# Patient Record
Sex: Male | Born: 1935 | Race: White | Hispanic: No | Marital: Married | Smoking: Never smoker
Health system: Southern US, Community
[De-identification: ages and names within clinical notes are randomized; demographics above are authoritative.]

## PROBLEM LIST (undated history)

## (undated) DIAGNOSIS — I739 Peripheral vascular disease, unspecified: Secondary | ICD-10-CM

## (undated) DIAGNOSIS — I10 Essential (primary) hypertension: Secondary | ICD-10-CM

## (undated) DIAGNOSIS — E785 Hyperlipidemia, unspecified: Secondary | ICD-10-CM

## (undated) HISTORY — PX: APPENDECTOMY: SHX54

## (undated) HISTORY — PX: PACEMAKER INSERTION: SHX728

## (undated) HISTORY — PX: INGUINAL HERNIA REPAIR: SUR1180

## (undated) HISTORY — DX: Essential (primary) hypertension: I10

## (undated) HISTORY — DX: Hyperlipidemia, unspecified: E78.5

## (undated) HISTORY — DX: Peripheral vascular disease, unspecified: I73.9

---

## 2004-12-30 ENCOUNTER — Ambulatory Visit: Payer: Self-pay | Admitting: Family Medicine

## 2005-01-30 ENCOUNTER — Ambulatory Visit: Payer: Self-pay | Admitting: Family Medicine

## 2005-02-06 ENCOUNTER — Encounter (INDEPENDENT_AMBULATORY_CARE_PROVIDER_SITE_OTHER): Payer: Self-pay | Admitting: Family Medicine

## 2005-02-06 LAB — CONVERTED CEMR LAB: PSA: 4.2 ng/mL

## 2005-02-08 ENCOUNTER — Encounter (HOSPITAL_COMMUNITY): Admission: RE | Admit: 2005-02-08 | Discharge: 2005-03-10 | Payer: Self-pay | Admitting: Family Medicine

## 2005-03-13 ENCOUNTER — Encounter (HOSPITAL_COMMUNITY): Admission: RE | Admit: 2005-03-13 | Discharge: 2005-04-12 | Payer: Self-pay | Admitting: Family Medicine

## 2005-03-21 ENCOUNTER — Ambulatory Visit: Payer: Self-pay | Admitting: Family Medicine

## 2005-04-07 ENCOUNTER — Ambulatory Visit: Payer: Self-pay | Admitting: Family Medicine

## 2005-04-17 ENCOUNTER — Encounter (HOSPITAL_COMMUNITY): Admission: RE | Admit: 2005-04-17 | Discharge: 2005-05-17 | Payer: Self-pay | Admitting: Family Medicine

## 2005-07-17 ENCOUNTER — Ambulatory Visit: Payer: Self-pay | Admitting: Family Medicine

## 2005-07-19 ENCOUNTER — Ambulatory Visit (HOSPITAL_COMMUNITY): Admission: RE | Admit: 2005-07-19 | Discharge: 2005-07-19 | Payer: Self-pay | Admitting: Family Medicine

## 2005-10-17 ENCOUNTER — Ambulatory Visit: Payer: Self-pay | Admitting: Family Medicine

## 2005-10-17 ENCOUNTER — Inpatient Hospital Stay (HOSPITAL_COMMUNITY): Admission: EM | Admit: 2005-10-17 | Discharge: 2005-10-20 | Payer: Self-pay | Admitting: Emergency Medicine

## 2005-10-18 ENCOUNTER — Ambulatory Visit: Payer: Self-pay | Admitting: *Deleted

## 2005-10-20 ENCOUNTER — Ambulatory Visit (HOSPITAL_COMMUNITY): Admission: RE | Admit: 2005-10-20 | Discharge: 2005-10-21 | Payer: Self-pay | Admitting: Internal Medicine

## 2005-10-20 ENCOUNTER — Ambulatory Visit: Payer: Self-pay | Admitting: Internal Medicine

## 2005-11-02 ENCOUNTER — Ambulatory Visit: Payer: Self-pay

## 2005-11-03 ENCOUNTER — Ambulatory Visit: Payer: Self-pay | Admitting: Internal Medicine

## 2005-11-03 ENCOUNTER — Ambulatory Visit (HOSPITAL_COMMUNITY): Admission: RE | Admit: 2005-11-03 | Discharge: 2005-11-03 | Payer: Self-pay | Admitting: Internal Medicine

## 2005-11-22 ENCOUNTER — Ambulatory Visit: Payer: Self-pay

## 2005-11-28 ENCOUNTER — Ambulatory Visit: Payer: Self-pay | Admitting: Family Medicine

## 2005-12-28 ENCOUNTER — Ambulatory Visit: Payer: Self-pay | Admitting: Family Medicine

## 2005-12-29 ENCOUNTER — Encounter (INDEPENDENT_AMBULATORY_CARE_PROVIDER_SITE_OTHER): Payer: Self-pay | Admitting: Family Medicine

## 2005-12-29 LAB — CONVERTED CEMR LAB: PSA: 5 ng/mL

## 2006-01-25 ENCOUNTER — Ambulatory Visit: Payer: Self-pay | Admitting: Family Medicine

## 2006-01-26 ENCOUNTER — Ambulatory Visit (HOSPITAL_COMMUNITY): Admission: RE | Admit: 2006-01-26 | Discharge: 2006-01-26 | Payer: Self-pay | Admitting: Family Medicine

## 2006-01-26 ENCOUNTER — Encounter (INDEPENDENT_AMBULATORY_CARE_PROVIDER_SITE_OTHER): Payer: Self-pay | Admitting: Family Medicine

## 2006-01-26 LAB — CONVERTED CEMR LAB
Blood Glucose, Fasting: 89 mg/dL
RBC count: 4 10*6/uL
TSH: 2.367 microintl units/mL
WBC, blood: 4.1 10*3/uL

## 2006-02-01 ENCOUNTER — Ambulatory Visit: Payer: Self-pay | Admitting: Family Medicine

## 2006-02-06 ENCOUNTER — Ambulatory Visit: Payer: Self-pay | Admitting: Internal Medicine

## 2006-03-05 ENCOUNTER — Ambulatory Visit: Payer: Self-pay | Admitting: Family Medicine

## 2006-03-07 ENCOUNTER — Ambulatory Visit (HOSPITAL_COMMUNITY): Admission: RE | Admit: 2006-03-07 | Discharge: 2006-03-07 | Payer: Self-pay | Admitting: Family Medicine

## 2006-03-09 ENCOUNTER — Ambulatory Visit (HOSPITAL_COMMUNITY): Admission: RE | Admit: 2006-03-09 | Discharge: 2006-03-09 | Payer: Self-pay | Admitting: Internal Medicine

## 2006-04-04 ENCOUNTER — Ambulatory Visit (HOSPITAL_COMMUNITY): Admission: RE | Admit: 2006-04-04 | Discharge: 2006-04-04 | Payer: Self-pay | Admitting: Internal Medicine

## 2006-04-23 ENCOUNTER — Ambulatory Visit: Payer: Self-pay | Admitting: Family Medicine

## 2006-05-08 ENCOUNTER — Ambulatory Visit: Payer: Self-pay | Admitting: Family Medicine

## 2006-07-31 IMAGING — CR DG HIP COMPLETE 2+V*R*
4 series · 4 of 4 positions shown · non-contrast
Comparison: none

CLINICAL DATA: Right groin pain for six months.  Right leg pain.
 RIGHT HIP ?2 VIEWS:

[view not recorded (1 of 4)]
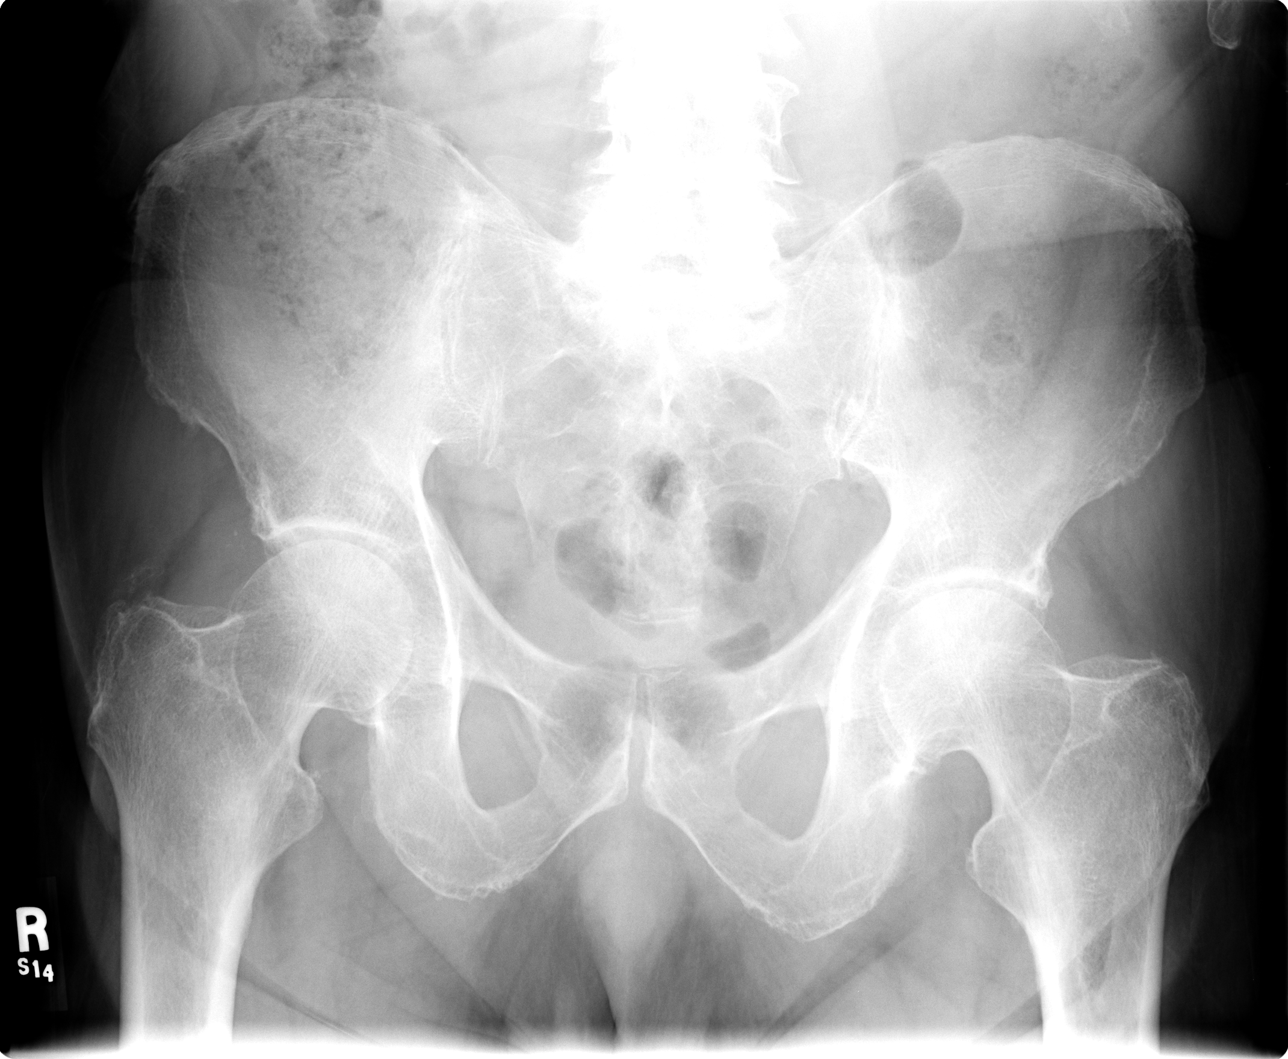

[view not recorded (2 of 4)]
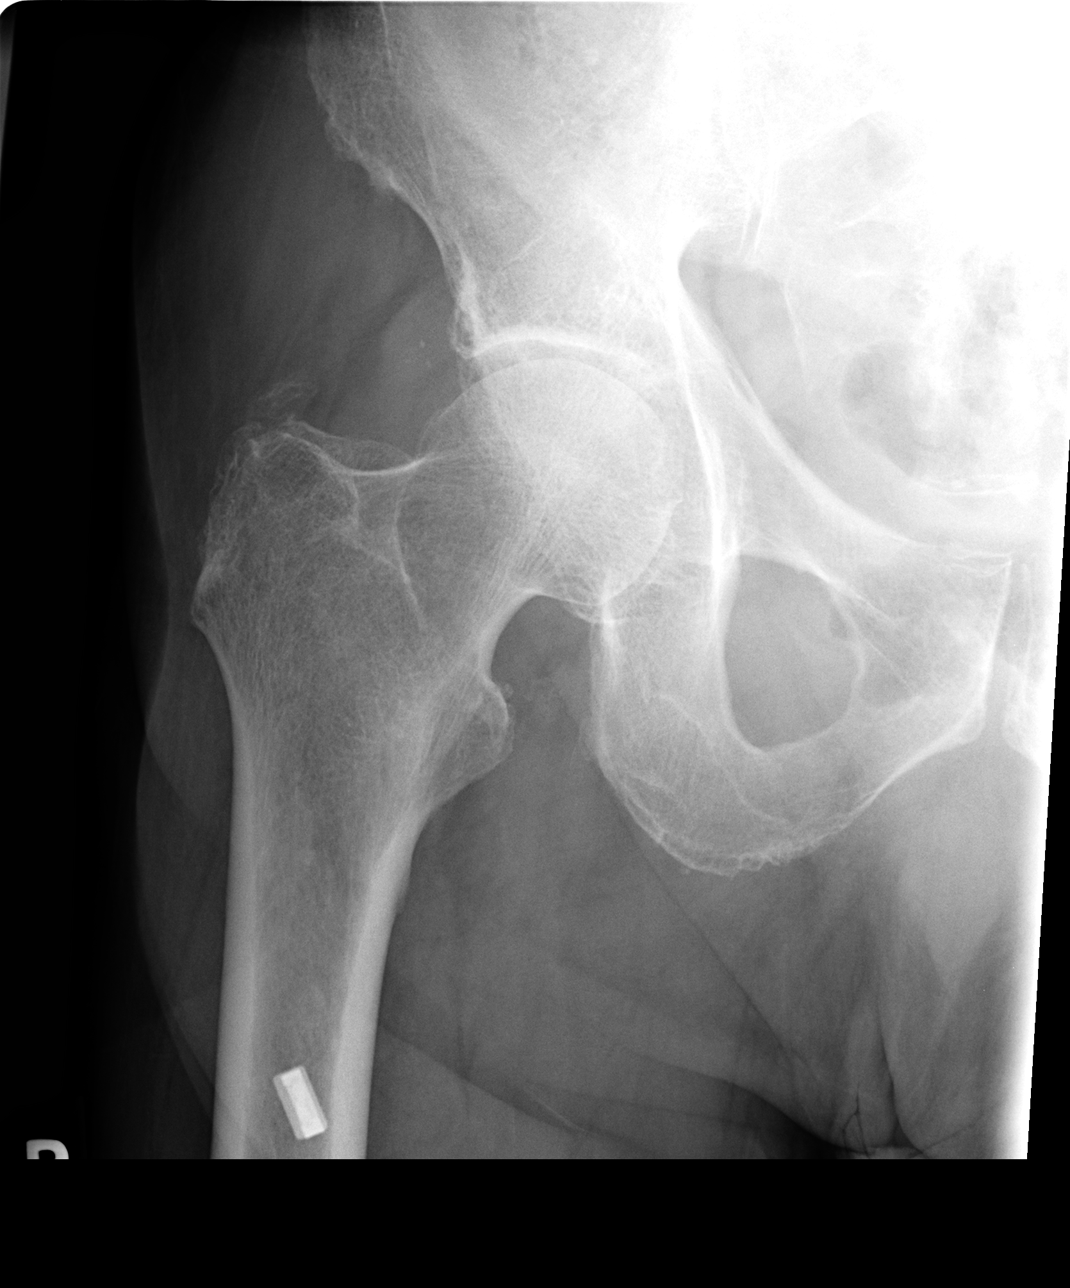

[view not recorded (3 of 4)]
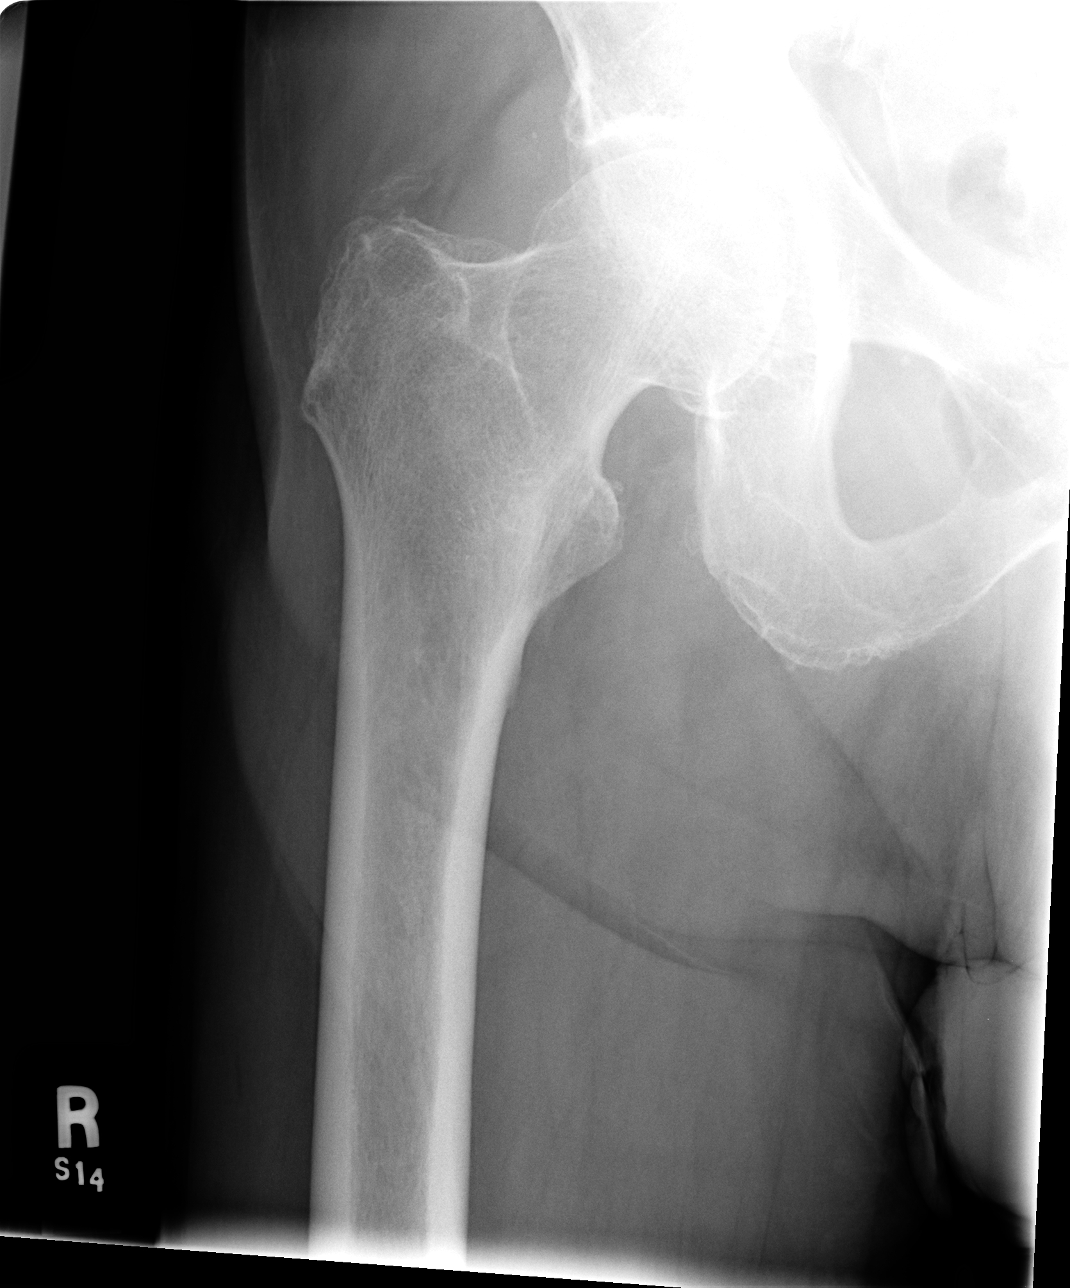

[view not recorded (4 of 4)]
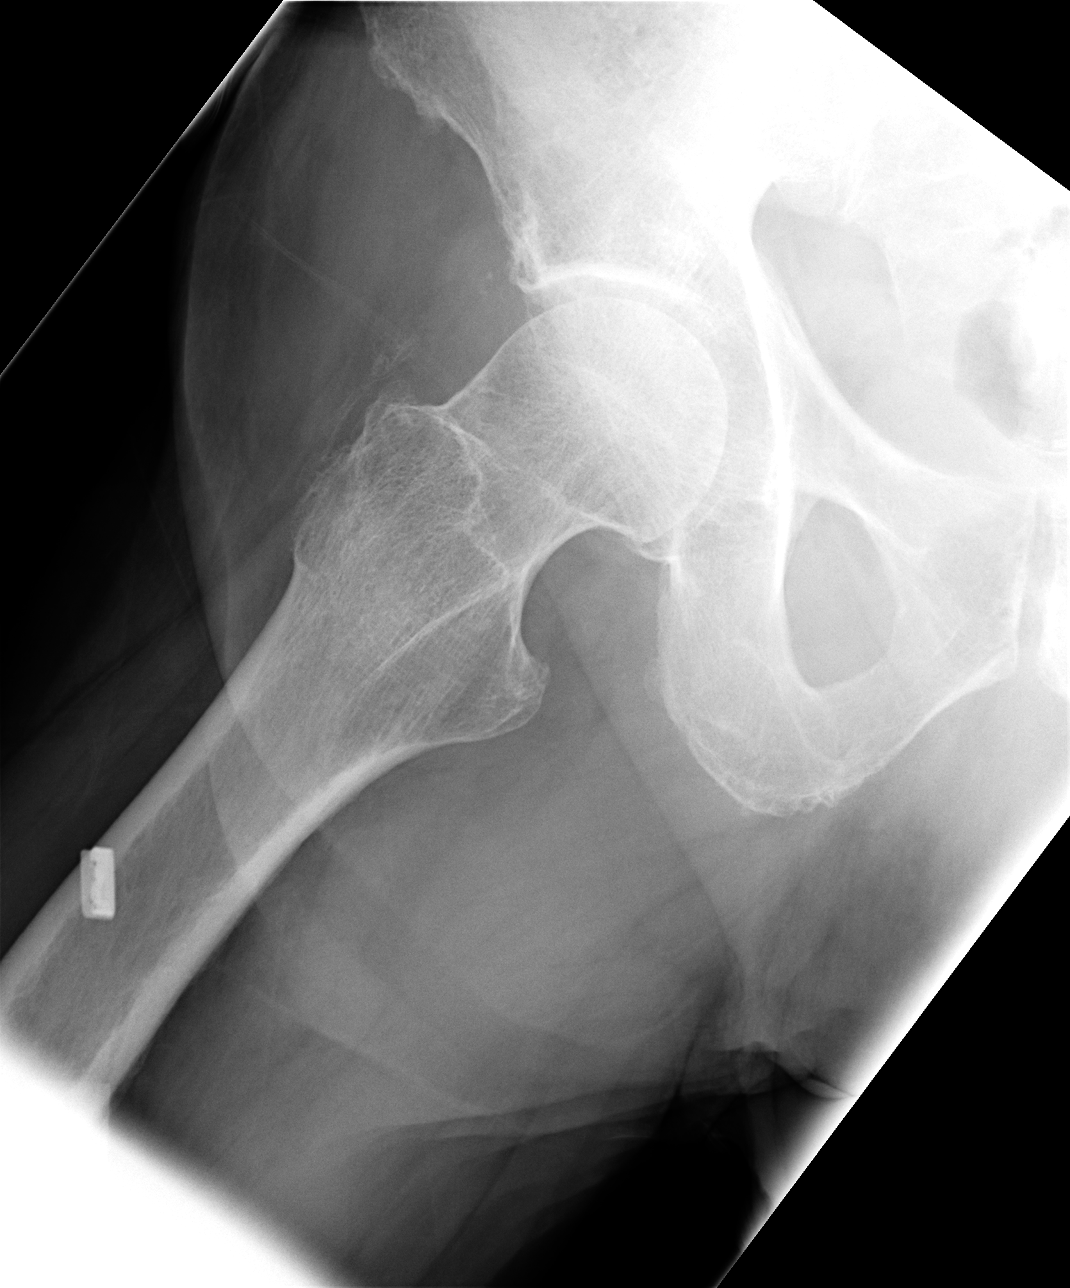

[4 of 4 positions shown; findings below may reference images not displayed]

FINDINGS: The joint space shows minimal narrowing and there is no evidence of fracture or subluxation.  There is some soft tissue calcification above the greater trochanter due to bursitis.  There is no evidence of avascular necrosis.
IMPRESSION: Minimal cartilage loss in the right hip.
 Calcific bursitis.

## 2006-08-08 ENCOUNTER — Ambulatory Visit: Payer: Self-pay | Admitting: Internal Medicine

## 2006-08-21 ENCOUNTER — Encounter: Payer: Self-pay | Admitting: Family Medicine

## 2006-08-21 DIAGNOSIS — I739 Peripheral vascular disease, unspecified: Secondary | ICD-10-CM | POA: Insufficient documentation

## 2006-08-21 DIAGNOSIS — I10 Essential (primary) hypertension: Secondary | ICD-10-CM | POA: Insufficient documentation

## 2006-08-21 DIAGNOSIS — L570 Actinic keratosis: Secondary | ICD-10-CM

## 2006-08-21 DIAGNOSIS — M1990 Unspecified osteoarthritis, unspecified site: Secondary | ICD-10-CM | POA: Insufficient documentation

## 2006-08-21 DIAGNOSIS — I498 Other specified cardiac arrhythmias: Secondary | ICD-10-CM | POA: Insufficient documentation

## 2006-08-21 DIAGNOSIS — R972 Elevated prostate specific antigen [PSA]: Secondary | ICD-10-CM

## 2006-08-21 DIAGNOSIS — E785 Hyperlipidemia, unspecified: Secondary | ICD-10-CM

## 2006-08-21 DIAGNOSIS — M545 Low back pain: Secondary | ICD-10-CM

## 2006-08-21 DIAGNOSIS — M48061 Spinal stenosis, lumbar region without neurogenic claudication: Secondary | ICD-10-CM

## 2006-09-07 ENCOUNTER — Ambulatory Visit: Payer: Self-pay | Admitting: Internal Medicine

## 2006-11-02 ENCOUNTER — Ambulatory Visit: Payer: Self-pay | Admitting: Internal Medicine

## 2006-11-12 ENCOUNTER — Encounter (INDEPENDENT_AMBULATORY_CARE_PROVIDER_SITE_OTHER): Payer: Self-pay | Admitting: Family Medicine

## 2006-11-12 ENCOUNTER — Telehealth (INDEPENDENT_AMBULATORY_CARE_PROVIDER_SITE_OTHER): Payer: Self-pay | Admitting: Family Medicine

## 2006-12-28 ENCOUNTER — Ambulatory Visit: Payer: Self-pay | Admitting: Internal Medicine

## 2007-01-28 ENCOUNTER — Ambulatory Visit: Payer: Self-pay | Admitting: Family Medicine

## 2007-01-28 DIAGNOSIS — H9190 Unspecified hearing loss, unspecified ear: Secondary | ICD-10-CM | POA: Insufficient documentation

## 2007-02-22 ENCOUNTER — Ambulatory Visit: Payer: Self-pay | Admitting: Internal Medicine

## 2007-04-12 ENCOUNTER — Ambulatory Visit: Payer: Self-pay | Admitting: Internal Medicine

## 2007-05-14 ENCOUNTER — Encounter (INDEPENDENT_AMBULATORY_CARE_PROVIDER_SITE_OTHER): Payer: Self-pay | Admitting: Family Medicine

## 2007-06-14 ENCOUNTER — Ambulatory Visit: Payer: Self-pay | Admitting: Internal Medicine

## 2007-07-31 ENCOUNTER — Ambulatory Visit: Payer: Self-pay | Admitting: Internal Medicine

## 2007-08-09 ENCOUNTER — Ambulatory Visit: Payer: Self-pay | Admitting: Internal Medicine

## 2007-10-04 ENCOUNTER — Ambulatory Visit: Payer: Self-pay | Admitting: Internal Medicine

## 2008-01-03 ENCOUNTER — Ambulatory Visit: Payer: Self-pay | Admitting: Internal Medicine

## 2008-02-05 ENCOUNTER — Ambulatory Visit: Payer: Self-pay

## 2008-08-11 ENCOUNTER — Ambulatory Visit: Payer: Self-pay | Admitting: Internal Medicine

## 2009-01-13 ENCOUNTER — Encounter (INDEPENDENT_AMBULATORY_CARE_PROVIDER_SITE_OTHER): Payer: Self-pay | Admitting: *Deleted

## 2009-02-02 ENCOUNTER — Encounter (INDEPENDENT_AMBULATORY_CARE_PROVIDER_SITE_OTHER): Payer: Self-pay | Admitting: *Deleted

## 2009-04-14 ENCOUNTER — Encounter: Payer: Self-pay | Admitting: Internal Medicine

## 2009-04-14 ENCOUNTER — Ambulatory Visit: Payer: Self-pay

## 2009-08-11 ENCOUNTER — Ambulatory Visit: Payer: Self-pay | Admitting: Internal Medicine

## 2009-08-11 DIAGNOSIS — Z950 Presence of cardiac pacemaker: Secondary | ICD-10-CM

## 2010-01-31 ENCOUNTER — Ambulatory Visit: Payer: Self-pay

## 2010-01-31 ENCOUNTER — Encounter: Payer: Self-pay | Admitting: Internal Medicine

## 2010-07-29 ENCOUNTER — Telehealth: Payer: Self-pay | Admitting: Internal Medicine

## 2010-08-22 ENCOUNTER — Ambulatory Visit: Payer: Self-pay | Admitting: Internal Medicine

## 2010-08-23 ENCOUNTER — Encounter: Payer: Self-pay | Admitting: Internal Medicine

## 2010-10-16 ENCOUNTER — Encounter: Payer: Self-pay | Admitting: *Deleted

## 2010-10-25 NOTE — Progress Notes (Signed)
Summary: re appt   Phone Note Call from Patient   Caller: Patient [phone] Reason for Call: Talk to Nurse Summary of Call: pt's wife is ill and he doesn't want to leave her for the time it would take to come here to see taylor for a pacer check, wants to know if we can schedule him at [facility] hospital? Initial call taken by: Glynda Jaeger,  July 29, 2010 10:10 AM  Follow-up for Phone Call        pt can't leave his wife alone for long and can't come to [place] he would like to go to [place] office, will have sch call him and resch appt w/EP MD in [facility] office Meredith Staggers, RN  July 29, 2010 10:45 AM

## 2010-10-25 NOTE — Cardiovascular Report (Signed)
Summary: Office Visit   Office Visit   Imported By: Roderic Ovens 02/08/2010 11:13:38  _____________________________________________________________________  External Attachment:    Type:   Image     Comment:   External Document

## 2010-10-25 NOTE — Assessment & Plan Note (Signed)
Summary: pacer check   Visit Type:  Follow-up Primary Provider:  dr Johny Sax in [place]  CC:  no cardiology complaints.  History of Present Illness: [name] returns today for followup.  He is a pleasant 77 yo man with a h/o symptomatic bradycardia and is s/p PPM.  He has a h/o HTN and dyslipidemia.  No c/p, sob, peripheral edema. His main problem is pain in his legs when he walks. Symptoms improve when he stops and rests.  No syncope.  Current Medications (verified): 1)  B Complex 100  Tabs (B Complex Vitamins) .... Take 1 Tab Daily 2)  Vitamin D 400 Unit Caps (Cholecalciferol) .... Take 1 Tab Daily 3)  Vitamin B-12 500 Mcg Tabs (Cyanocobalamin) .... Take 1 Tab Daily  Allergies (verified): No Known Drug Allergies  Past History:  Past Medical History: Last updated: 08/21/2006 Hyperlipidemia Hypertension Peripheral vascular disease  Past Surgical History: Last updated: 08/21/2006 Appendectomy Inguinal herniorrhaphy Permanent pacemaker  Review of Systems  The patient denies chest pain, syncope, dyspnea on exertion, and peripheral edema.    Vital Signs:  Patient profile:   77 year old male Weight:      154 pounds BMI:     22.18 Pulse rate:   69 / minute BP sitting:   105 / 55  (right arm)  Vitals Entered By: Dreama Saa, CNA (August 22, 2010 2:33 PM)  Physical Exam  General:  Elderly, well developed, well nourished, in no acute distress.  HEENT: normal Neck: supple. No JVD. Carotids 2+ bilaterally no bruits Cor: RRR no rubs, gallops or murmur Lungs: CTA. Well healed PPM incision. Ab: soft, nontender. nondistended. No HSM. Good bowel sounds Ext: warm. no cyanosis, clubbing or edema Neuro: alert and oriented. Grossly nonfocal. affect pleasant    PPM Specifications Following MD:  Lewayne Bunting, MD     PPM Vendor:  Guidant     PPM Model Number:  [id number]     PPM Serial Number:  [id number] PPM DOI:  11/03/2005     PPM Implanting MD:  Lewayne Bunting, MD  Lead 1     Location: RA     DOI: 10/20/2005     Model #: [id number]     Serial #: [id number]     Status: active Lead 2    Location: RV     DOI: 10/20/2005     Model #: [id number]     Serial #: [id number]     Status: active   Indications:  Huston Foley   PPM Follow Up Remote Check?  No Battery Voltage:  good V     Battery Est. Longevity:  > 5 years     Pacer Dependent:  No       PPM Device Measurements Atrium  Amplitude: 1.8 mV, Impedance: 600 ohms, Threshold: 0.6 V at 0.4 msec Right Ventricle  Amplitude: 12 mV, Impedance: 720 ohms, Threshold: 0.7 V at 0.4 msec  Episodes MS Episodes:  0     Percent Mode Switch:  0     Coumadin:  No Atrial Pacing:  52%     Ventricular Pacing:  45%  Parameters Mode:  DDDR     Lower Rate Limit:  55     Upper Rate Limit:  130 Paced AV Delay:  300     Sensed AV Delay:  200 Next Cardiology Appt Due:  01/24/2011 Tech Comments:  No parameter changes. Device function normal.  TTM's with [employer].  ROV 6 months RDS clinic. Altha Harm, LPN  August 22, 2010 2:46 PM  MD Comments:  Agree with above.  Impression & Recommendations:  Problem # 1:  CARDIAC PACEMAKER IN SITU (ICD-V45.01) His device is working normally. Will recheck in several months.  Problem # 2:  PERIPHERAL VASCULAR DISEASE (ICD-443.9) He is having worsening symptoms.  It is unclear whether this is spinal stenosis or PAD.  He is scheduled by Dr. Neita Carp to undergo eval. tomorrow. Will follow.  If he has non-invasive evidence of PAD, I have recommended he see one of my partners for evaluations.  Problem # 3:  HYPERTENSION (ICD-401.9) A low fat diet is recommended.

## 2010-10-25 NOTE — Assessment & Plan Note (Signed)
Summary: 6 MO F/U   Current Medications (verified): 1)  None  Allergies (verified): No Known Drug Allergies   PPM Specifications Following MD:  Lewayne Bunting, MD     PPM Vendor:  Guidant     PPM Model Number:  [id number]     PPM Serial Number:  [id number] PPM DOI:  11/03/2005     PPM Implanting MD:  Lewayne Bunting, MD  Lead 1    Location: RA     DOI: 10/20/2005     Model #: [id number]     Serial #: [id number]     Status: active Lead 2    Location: RV     DOI: 10/20/2005     Model #: [id number]     Serial #: [id number]     Status: active   Indications:  Huston Foley   PPM Follow Up Battery Voltage:  GOOD V     Battery Est. Longevity:  >5 YRS     Pacer Dependent:  No       PPM Device Measurements Atrium  Amplitude: 2.0 mV, Impedance: 580 ohms, Threshold: 0.5 V at 0.40 msec Right Ventricle  Amplitude: 12.0 mV, Impedance: 660 ohms, Threshold: 0.7 V at 0.40 msec  Episodes MS Episodes:  8     Percent Mode Switch:  0     Coumadin:  No Ventricular High Rate:  0     Atrial Pacing:  53%     Ventricular Pacing:  43%  Parameters Mode:  DDDR     Lower Rate Limit:  55     Upper Rate Limit:  130 Paced AV Delay:  300     Sensed AV Delay:  200 Next Cardiology Appt Due:  07/26/2010 Tech Comments:  EPISODES WERE FOR NOISE ON ATRIAL LEAD.  IMPEDANCES AND THRESHOLD STABLE.  WILL MONITOR FOR NOW.  NORMAL DEVICE FUNCTION. ROV IN 6 MTHS.  Vella Kohler  Jan 31, 2010 2:27 PM MD Comments:  Agree with above.

## 2011-02-07 NOTE — Assessment & Plan Note (Signed)
[name] [name]                         ELECTROPHYSIOLOGY OFFICE NOTE   NAME:[name] [name], [name] [name]                    MRN:          [id number]  DATE:08/11/2008                            DOB:          1921-03-07    [name] [name] returns today for followup.  He is a pleasant elderly male  with a history of complete heart block and symptomatic bradycardia and  hypertension.  He is status post pacemaker insertion back in February  2007.  He returns today for followup.  He denies chest pain.  Denies  shortness of breath.  The patient's only complaint today is that of  lower back pain as well as some pain in his legs when he stands up.  However, he is able to exercise without difficulty and stated that  yesterday he was out to mowing his grass.  He is only on multivitamin.  Otherwise, no medicines.   PHYSICAL EXAMINATION:  GENERAL:  He is a pleasant, well-appearing  elderly man in no distress.  VITAL SIGNS:  Blood pressure was 128/80, the pulse 70 and regular,  respirations were 18.  The weight was 163 pounds.  NECK:  No jugular vein distention.  LUNGS:  Clear bilaterally to auscultation.  No wheezes, rales, or  rhonchi are present.  There is no increased work of breathing.  CARDIOVASCULAR:  Regular rate and rhythm.  Normal S1 and S2.  EXTREMITIES:  No edema.   Interrogation of his pacemaker demonstrates a Guidant Insignia implanted  in February 2007.  The P-waves were 1.9, the R-waves greater than 12,  the impedance 590 in the A, 650 in the RV, threshold 0.4 at 0.4 in the  atrium, 0.5 at 0.4 in the RV.  The underlying rhythm was escape at 35  beats per minute.  He was 66% A paced and 40% V paced.   IMPRESSION:  1. Intermittent heart block.  2. Symptomatic bradycardia.  3. Status post pacemaker insertion.   DISCUSSION:  Overall, [name] [name] is stable.  He is doing amazingly well  despite his 87 years.  We will plan to see him back in the office for  followup in 1 year.     Doylene Canning. Ladona Ridgel, MD  Electronically Signed    GWT/MedQ  DD: 08/11/2008  DT: 08/12/2008  Job #: [id number]

## 2011-02-07 NOTE — Assessment & Plan Note (Signed)
[facility]                         ELECTROPHYSIOLOGY OFFICE NOTE   NAME:Litts, DILLINGER ASTON                    MRN:          [id number]  DATE:07/31/2007                            DOB:          11-25-1920    Mr. Traweek returns today for followup.  He is a very pleasant elderly  male with a history of symptomatic bradycardia, who is status post  permanent pacemaker insertion back in February 2007.  He returns today  for followup.  His only real complaint is that of tingling sensations in  his feet and his lower extremities.  This is not related to exertion; it  is not related to any vigorous physical activity.  The patient still  mows his own yard walking behind the lawnmower, and does plenty of yard  work without any symptoms.  He states that he gets tingling in his feet  and legs when he stands up, particularly in the morning and begins to  walk as he has gotten out of bed.  He had no other complaints.  He  denies chest pain, he denies shortness of breath, he denies  palpitations.   PHYSICAL EXAMINATION:  He is a pleasant, well appearing elderly man in  no acute distress.  Blood pressure 122/70, pulse 55 and regular,  respirations 18, weight 172 pounds.  NECK:  Revealed no jugular venous distention.  LUNGS:  Clear bilaterally to auscultation.  No wheezes, rales or rhonchi  were present.  CARDIOVASCULAR:  Regular rate and rhythm with normal S1 and S2.  There  were no murmurs, rubs or gallops.  EXTREMITIES:  Demonstrate no clubbing, cyanosis or edema.  Pulses in his  lower extremities were normal.   INTERROGATION OF PACEMAKER:  Demonstrates a Guidant Insignia.  There  were no P-waves secondary to his underlying sinus node dysfunction.  The  R-waves were greater than 12.  The impedance was 600 in the atrium and  700 in the ventricular.  Thresholds were 0.6 at 0.4 in the atrium; 0.8  at 0.4 in the right ventricular.  The battery was at beginning of  life.  He was A-paced at 30 beats per minute today.  Overall he is 53% A-paced.   IMPRESSION:  1. Symptomatic bradycardia, secondary to sinus node dysfunction.  2. History of hypertension.  3. Status post pacemaker insertion.   DISCUSSION:  Overall [name] [name] is stable, and his pacemaker is working  normally.  We will plan on seeing him back in the office in 6 months in  our [place] office, and one year with me here in [place].     Doylene Canning. Ladona Ridgel, MD  Electronically Signed   GWT/MedQ  DD: 07/31/2007  DT: 08/01/2007  Job #: [id number]   cc:   Fara Chute

## 2011-02-10 NOTE — Assessment & Plan Note (Signed)
[facility]                           ELECTROPHYSIOLOGY OFFICE NOTE   NAME:[name], [name] SERIO                    MRN:          [id number]  DATE:08/08/2006                            DOB:          1921/07/04    HISTORY OF PRESENT ILLNESS:  [name] [name] returns today for followup. He is a  very pleasant elderly male with a history of symptomatic bradycardia, who  also has a history of diet controlled hypertension, who is status post  pacemaker insertion back in February. He returns today for followup. He  denies chest pain or shortness of breath. His main complaint today is that  of pain in his right hip. He has in the past, seen one of the rehabilitation  doctors in town for evaluation and treatment and has undergone therapy for  this.   PHYSICAL EXAMINATION:  GENERAL:  A pleasant, well appearing elderly man in  no distress.  VITAL SIGNS:  Blood pressure today was 110/70, pulse 55 and regular.  Respirations were 18. Weight was 165 pounds.  NECK:  No jugular venous distention.  LUNGS:  Clear bilaterally to auscultation.  CARDIOVASCULAR:  Regular rate and rhythm with normal S1 and S2.  EXTREMITIES:  Demonstrated no clubbing, cyanosis, or edema.   LABORATORY/DIAGNOSTIC STUDIES:  EKG demonstrates atrial pacing and  significant ventricular sensing.   PACEMAKER INTERROGATION:  Interrogations of his pacemaker today demonstrates  a Guidant Insignia with P and R waves of 5 and 18, respectively. The pacing  impedance was 650 in the atrium, 700 in the ventricle. Threshold was 0.6 and  0.4 in the right atrium, 0.6 and 0.4 in the right ventricle. There were no  mode switches today. His outputs were turned down to 2 and 2 1/2 in the  atrium and ventricle, respectively.   IMPRESSION:  1. Symptomatic bradycardia.  2. Chronic hip and back pain.  3. Status post pacemaker insertion.   DISCUSSION:  Overall, [name] [name] is stable and his pacemaker is working  normally. We will plan to see him back in a year.     Doylene Canning. Ladona Ridgel, MD  Electronically Signed    GWT/MedQ  DD: 08/08/2006  DT: 08/08/2006  Job #: [id number]   cc:   Gennaro Africa, M.D.

## 2011-02-10 NOTE — H&P (Signed)
[name] [name], [name] [name]             ACCOUNT NO.:  [id number]   MEDICAL RECORD NO.:  [id number]          PATIENT TYPE:  INP   LOCATION:  IC04                          FACILITY:  [facility]   PHYSICIAN:  Margaretmary Dys, M.D.DATE OF BIRTH:  12-28-1920   DATE OF ADMISSION:  10/17/2005  DATE OF DISCHARGE:  LH                                HISTORY & PHYSICAL   PRIMARY CARE PHYSICIAN:  Tarri Abernethy, MD   CHIEF COMPLAINT:  Bradycardia.   HISTORY OF PRESENT ILLNESS:  [name] is an 77 year old Caucasian  male with no significant past medical history, who presented to his primary  care doctor today for a regularly scheduled visit.  His last visit to Dr.  Early Chars was about 2-3 months ago.  The patient at the time was fine, had no  high blood pressure; neither were there any concerns about his heart rhythm.  However, on his return today, he was found to have a heart rate in the 30s  and documented 38 in the doctor's office.  He subsequently advised the  patient to come into the emergency room, which he did.  On arrival in the  emergency room, the patient's heart rate  fluctuated between 28 and 41; all  through this, the patient remained asymptomatic.  He denies any chest pain.  He denies any dizziness.  No lightheadedness.  No headaches.  Actually, the  patient was not sure if he wanted to come into the emergency room as he felt  great.  While he was found to be bradycardic with rates in the 20s, the  patient's blood pressure never dropped and he did not complain of any  symptoms.  He is being admitted now for further evaluation and management.  The patient does not take any medications on a scheduled basis.  The only  thing that is new that he has done in the last 2 weeks is that he started  using over-the-counter medication he bought in the mail called chelating  therapy.   He subsequently has been using the tablet twice a day.  I do not know the  constituents of this medication , he  will bring the bottle so that we can  take a look and see if the active ingredients are listed.  He denies any  skin rash and overall he remains very active with no change in his exercise  tolerance recently.   REVIEW OF SYSTEMS:  Ten-point review of systems is otherwise negative except  as mentioned in history of present illness.   PAST MEDICAL HISTORY:  None of note.   MEDICATIONS:  Chelating agent twice daily.   ALLERGIES:  No known drug allergies.   FAMILY HISTORY:  Noncontributory.   SOCIAL HISTORY:  The patient is married, has 2 children and several  grandchildren and great-grandchildren, some of whom were present in the room  when I interviewed him.  He is a lifelong nonsmoker, does not drink alcohol,  denies any illicit drug use.  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   PHYSICAL EXAMINATION:  GENERAL:  Conscious,  alert, comfortable, not in acute  distress.  The patient was well-oriented in time, place and person and was  very pleasant.  VITAL SIGNS:  Blood pressure was 130/68.  Pulse ranged between 29-51.  Respirations were 16.  T-max was 98.5.  Oxygen saturation was 99% on room  air.  HEENT:  Normocephalic, atraumatic.  Oral mucosa was moist.  No exudates were  noted.  NECK:  Supple.  No JVD.  No lymphadenopathy.  LUNGS:  Clear clinically with good air entry bilaterally.  HEART:  S1 and S2, bradycardic, with no S3, S4, gallops or rubs.  No  premature beats.  ABDOMEN:  Soft and nontender.  Bowel sounds were positive.  No masses  palpable.  EXTREMITIES:  No pitting pedal edema.  No calf induration or tenderness were  noted.  CNS:  Exam was grossly intact.  No focal deficits.  Sensation was intact.  Pupils were equal, reactive to light and accommodation.   LABORATORY DATA:  Twelve-lead EKG shows sinus bradycardia with no acute ST-T  changes.   White blood cell count was 5.3, hemoglobin of 14, hematocrit of 40; his MCV  was 100.4;  platelet count was 147,000; there was no left shift.  PT was  13.6, INR 1.0, PTT 36.  D-dimer was 0.56.  Sodium 137, potassium 4.0,  chloride of 104, glucose of 87, BUN of 22, creatinine 1.2, total bilirubin  0.4, AST 29, ALT 24, albumin was 3.5, calcium 8.5, magnesium 2.2.  Initial  cardiac enzymes were negative.  B natriuretic peptide was 321.  TSH is  pending.   Chest x-ray shows borderline cardiomegaly, otherwise was not remarkable.   ASSESSMENT AND PLAN:  [name] is an 77 year old Caucasian male who was  found to be bradycardic in his primary care physician's office, Dr. Early Chars,  today.   He was asymptomatic and remained asymptomatic.  He has no chest pain, no  shortness of breath, no lightheadedness.  He is also not hypotensive;  actually, he tended to trend more with elevated blood pressure.   The cause of his bradycardia is unclear at this time, but possibilities  include sick sinus syndrome versus conduction defect in the heart versus  possible iatrogenic effect from this recent over-the-counter herbal  medication that he has been using.   He started using this medication only 2 weeks ago.  His last visit 3 months  ago to Dr. Early Chars was unremarkable with no concerns for his heart rate.   The plan is to admit the patient to the intensive care unit at this time.  We will institute a bradycardia protocol in him.  We will monitor him very  closely.  If the patient's heart rate continues to drop and he becomes  symptomatic, I will transfer him to a tertiary center; however, if he  remains stable, I will request Cardiology to see him here in the morning; I  have requested a [place] consult.   I will cycle his cardiac enzymes.  I will also follow up on his TSH levels.   I recommended that the patient stop taking these supplements until we are  able to find out why he is so bradycardic.   I told him of the possibility of pacemaker placement and the patient was agreeable to that  possibility.   CODE STATUS:  He is a full code.      Margaretmary Dys, M.D.  Electronically Signed     AM/MEDQ  D:  10/18/2005  T:  10/18/2005  Job:  [id number]   cc:   Dorthula Rue. Early Chars, MD  Fax: [phone]

## 2011-02-10 NOTE — Discharge Summary (Signed)
[name] [name], [name] [name]             ACCOUNT NO.:  [id number]   MEDICAL RECORD NO.:  [id number]          PATIENT TYPE:  OIB   LOCATION:  2010                         FACILITY:  [facility]   PHYSICIAN:  Doylene Canning. Ladona Ridgel, M.D.  DATE OF BIRTH:  Apr 17, 1921   DATE OF ADMISSION:  10/20/2005  DATE OF DISCHARGE:  10/21/2005                                 DISCHARGE SUMMARY   PRINCIPAL DIAGNOSIS:  1.  Profound bradycardia with sinus arrest on telemetry greater than five      seconds.  2.  Bradycardia largely asymptomatic noted on regular physical examination      scheduled visit.  3.  Discharging day one status post implantation of Guidant Insignia dual      chamber pacemaker.   SECONDARY DIAGNOSIS:  1.  Echocardiogram October 18, 2005, showing ejection fraction 64-65%, no      left ventricular wall motion abnormalities.  2.  Status post appendectomy (for rupture).  3.  Hypertension.  4.  Status post inguinal herniorrhaphy.   PROCEDURE:  October 20, 2005, implantation of Guidant Insignia dual chamber  pacemaker for sinus pauses greater than five seconds in a setting of  profound bradycardia, Dr. Lewayne Bunting.   ALLERGIES:  No known drug allergies.   BRIEF HISTORY:  [name] is an 77 year old male with no significant past  medical history.  He presented to his primary care giver, Dr. Tarri Abernethy,  for a regular visitation.  He was found to have a heart rate in the 30s.  He  was largely asymptomatic with this.  He was advised to go to the emergency  room.  In the emergency room, he was given IV Atropine for heart rates in  the 20s and had some response to this.  He was admitted to telemetry and on  telemetry observation, was found to have pauses greater than five seconds in  a setting of profound bradycardia.  He underwent a 2D echocardiogram which  showed preserved left ventricular function and was scheduled for transfer to  [facility] for permanent pacemaker implantation.   HOSPITAL COURSE:  The patient arrived October 20, 2005.  He did not have any  complaint of chest pain or shortness of breath.  He underwent implantation  of permanent pacemaker by Dr. Lewayne Bunting.  This was a dual chamber device  and is scheduled for discharge day one post procedure.  On telemetry, his  heart rate is in the 50s and is currently not being paced.  On day one, his  incision was healing nicely without hematoma.  His chest x-ray will be  evaluated prior to discharge.  He will have an interrogation of the device.   DISCHARGE INSTRUCTIONS:  He has outpatient follow up with Dr. Lewayne Bunting  at [facility], [address] at the [facility],  Thursday, February 8 at 9 a.m.  He will see Dr. Ladona Ridgel in May 2007, Dr.  Lubertha Basque office will call for that appointment.  He is asked to keep his  incision dry for the next seven days and to sponge bathe until Friday,  February  2.  He is asked not to lift anything heavier than 10 pounds for the  next four weeks.  He is not to drive for the next week.  He will follow a  low sodium low cholesterol diet.   LABORATORY DATA:  Hemoglobin 14, hematocrit 40, platelets 147, white blood  cell count 5.2.  Serum electrolytes show sodium 137, potassium 4.1, chloride  108, carbonate 36, BUN 19, creatinine 1.2, glucose 71.  BNP was found to be  321.  D-dimer less than 0.56.      Maple Mirza, P.A.    ______________________________  Doylene Canning. Ladona Ridgel, M.D.    GM/MEDQ  D:  10/20/2005  T:  10/21/2005  Job:  [id number]   cc:   Dorthula Rue. Early Chars, MD  Fax: [phone]

## 2011-02-10 NOTE — Consult Note (Signed)
[name] [name], FEENY NO.:  [id number]   MEDICAL RECORD NO.:  [id number]          PATIENT TYPE:  INP   LOCATION:  IC04                          FACILITY:  [facility]   PHYSICIAN:  Vida Roller, M.D.   DATE OF BIRTH:  21-Sep-1921   DATE OF CONSULTATION:  10/18/2005  DATE OF DISCHARGE:                                   CONSULTATION   HISTORY OF PRESENT ILLNESS:  [name] is an 77 year old man who is  remarkable health prior to yesterday when he was noted to be markedly  bradycardic.  He was referred to the emergency department where he received  some IV Atropine for heart rates in the 20's.  He was asymptomatic at the  time, moderately hypertensive.  He appeared to have some response to  Atropine with an increase in his heart rate.  He was admitted to the  intensive care unit where he received another dose of Atropine for  significant bradycardia and once again appeared to increase his heart rate  enough to satisfy those who gave it, and he is now resting comfortably in  the intensive care unit without any complaints.  He has been asymptomatic  throughout the entire experience.  No chest pain.  No shortness of breath.  No syncope.  No near syncope.  He is a vigorous healthy man, and this was  found on a routine evaluation.   PAST MEDICAL HISTORY:  Ruptured appendix that happened about 60 years ago,  and he required surgery, and about 10 years ago he had to have a  herniorrhaphy.  He is, otherwise, without any other medical problems.  An  interesting corollary to this, is about two weeks ago he started an over-the-  counter chelation therapy regime at the recommendation of his wife.  I do  not know exactly these agents that were used to chelate him, but we are in  the process of investigating that.  He, otherwise, just takes vitamin  supplements, mostly a multivitamin.   HOSPITAL MEDICATIONS:  Lovenox 40 mg subcu q.h.s.   SOCIAL HISTORY:  He lives in [place] with his  wife.  He is retired, used to be  in [employer].  He does not smoke, drink or use illicit drugs.  He is  active doing yard work.  He and his wife practice a Mediterranean diet, as  they felt this was a very healthy way to go.   FAMILY HISTORY:  Mother died at age 110 of natural causes, felt not to have  coronary artery disease.  Father died at age 11 of a similar problem without  coronary disease.  He has 10 siblings, a sister and three brothers who have  passed away, none of them had coronary artery disease.  He has three sisters  and a brother who are still alive and have no coronary disease.  It appears  there are two siblings who he does not know.  Specifically, I did a detailed  family history for syncope, sudden death and pacemakers, and he appears not  to have any family history of any of those problems.  REVIEW OF SYSTEMS:  He denies any fever, chills, nausea or adenopathy.  No  headache, sinus discharge or sinus tenderness.  No rashes or lesions.  No  chest pain, shortness of breath, dyspnea on exertion, orthopnea, PND or  palpitations, presyncope or syncope.  He has had a cough for the last three  weeks.  Denies any urinary frequency or dysuria.  No nocturia.  He does have  some mild arthralgias in his hips and legs which have been chronic for a  long period of time.  No nausea, vomiting, diarrhea, bright red blood per  rectum or melena.  The remainder of his review of systems is negative.   PHYSICAL EXAMINATION:  GENERAL APPEARANCE:  Well-developed, well-nourished,  very pleasant white man who looks extremely healthy.  VITAL SIGNS:  Pulse was 50 in sinus on the monitor, respiratory rate 12,  blood pressure 158/68, weight 167.8 pounds, afebrile.  HEENT:  Unremarkable.  NECK:  Supple with no jugular venous distention or carotid bruits.  The  thyroid appears to be normal size in the midline.  CHEST:  Clear to auscultation.  CARDIAC:  Slightly bradycardic with a normal first  and second heart sound.  I did not appreciate a murmur.  ABDOMEN:  Soft, nontender.  EXTREMITIES:  Lower extremities without significant cyanosis, clubbing or  edema.  Pulses are 1+.  GU/BREASTS/RECTAL:  Deferred.  MUSCULOSKELETAL:  Grossly nonfocal.  NEUROLOGICAL:  Grossly nonfocal.   STUDIES:  There are several electrocardiograms on the chart.  Almost all of  them show sinus bradycardia at various rates.  He has normal axis, normal  intervals.  P-R interval, specifically, appears to be normal.  QRS duration  is 78 milliseconds.  QT is 402.  He has nonspecific ST-T wave changes, but  no Q waves and no ischemic ST-T wave changes.   LABORATORY DATA:  White blood cell count 5.3, H&H 14/40, platelets 147,000.  Sodium 138, potassium 4.1, chloride 108, bicarbonate 36, BUN 19, creatinine  1.2, blood sugars 71.  LFT's are normal.  Cardiac enzymes x1 are negative.  His INR is 1.0.  PTT is 36, D. dimer is 0.36, BNP 321.   IMPRESSION:  This is a gentleman who has asymptomatic sinus bradycardia.  No  clear etiology, and interesting that the over-the-counter medications were  started in coincident with this.  We will need to investigate that further.  He also has hypertension which appears to be new onset.  I think we need to  do the following.  1.  Echocardiogram, assess his LV systolic function, make sure his valvular      heart disease is adequate.  2.  Exercise perfusion study.  I am emphasizing the exercise part of this as      I need to make sure that the man is chronotropically competent with      exercise.  This obvious places an impact on how to treat this      bradycardia if it persists after the cessation of the chelation therapy.      We probably need to complete a cycle of his enzymes.  We will check his      free T4 and TSH.  I would check a urine drug screen.  It may be that he      is inadvertently taken in some substance which potentially could cause     problems, and it is  worthwhile looking into that as well.  Beyond that,      I  think it is probably worthwhile just to observe him over the course of      the next 24-48 hours off this chelation therapy to see exactly what      effect that has.      Vida Roller, M.D.  Electronically Signed     JH/MEDQ  D:  10/18/2005  T:  10/18/2005  Job:  [id number]   cc:   Dorthula Rue. Early Chars, MD  Fax: [phone]

## 2011-02-10 NOTE — Discharge Summary (Signed)
NAMEFREDRIK, [name]             ACCOUNT NO.:  [id number]   MEDICAL RECORD NO.:  [id number]          PATIENT TYPE:  INP   LOCATION:  IC04                          FACILITY:  [facility]   PHYSICIAN:  Margaretmary Dys, M.D.DATE OF BIRTH:  Jun 25, 1921   DATE OF ADMISSION:  10/17/2005  DATE OF DISCHARGE:  01/26/2007LH                                 DISCHARGE SUMMARY   DISCHARGE DIAGNOSES:  1.  Bradycardia.  2.  Probable hypertension.   HISTORY OF PRESENT ILLNESS:  [name] is an 77 year old Caucasian  male with no significant past medical history who was admitted on  10/17/2005.  Apparently the patient had gone into visit his primary care  doctor, Dr. Fonnie Mu, who found that he had severe bradycardia.   It was noted that the patient had just recently been on chelating medication  which was over the counter.   The contents of this which was reviewed, but it was unclear if either of  them to cause a severe bradycardia.  However, the patient was admitted to  the intensive care unit and while in the intensive care unit even though his  heart rate improved on the third day of admission he began to have sinus  pauses lasting five seconds.  It was felt the patient likely had sick sinus  syndrome and plan was to transfer him to [facility] for a  pacemaker.  The patient was initially resistant to having this pacemaker  done, he eventually agreed.  The patient has been transferred today October 20, 2005, he remains in satisfactory condition, he was asymptomatic  throughout his bradycardia episodes/sinus pauses, and his blood pressure  continued to be slightly elevated.   CONSULTATIONS:  Dr. Vida Roller, cardiology.   PROCEDURE PERFORMED:  A 2D echocardiogram was suspicious for possible aortic  aneurysm with ejection fraction normal at 60-65%.   PERTINENT LABORATORY DATA:  On admission white blood cell count 5.8,  hemoglobin 14.4, hematocrit 41.3, platelet count 163, PT  13.6, INR 1, D-  Dimer 0.56, cardiac enzymes negative throughout his admission.  Urine  toxicology screen was negative.   DISPOSITION:  The patient is being transferred to [facility] for  pacemaker placement.He will need a chest CT scan to further evaluate his  ?aneurysm.     Margaretmary Dys, M.D.  Electronically Signed    AM/MEDQ  D:  10/20/2005  T:  10/20/2005  Job:  [id number]

## 2011-02-10 NOTE — Consult Note (Signed)
[name], [name]             ACCOUNT NO.:  [id number]   MEDICAL RECORD NO.:  [id number]          PATIENT TYPE:  OIB   LOCATION:  2010                         FACILITY:  [facility]   PHYSICIAN:  Doylene Canning. Ladona Ridgel, M.D.  DATE OF BIRTH:  [birth year]   DATE OF CONSULTATION:  10/20/2005  DATE OF DISCHARGE:                                   CONSULTATION   REASON FOR CONSULTATION:  Evaluation of bradycardia in a patient with  hypertension.   HISTORY OF PRESENT ILLNESS:  The patient is a very pleasant 77 year old man  who was seen by his primary care physician several days ago and found to  have a heart rate in the 30s and was admitted to the hospital.  He was  hypertensive.  He denied syncope.  He did have some fatigue but denied  shortness of breath and chest pain.  He was subsequently admitted to the  hospital where he had heart rates down into the 20s and pauses over five  seconds which would respond to Atropine.  Initially, the patient had refused  pacemaker implantation but subsequently agreed to proceed with this.  He is  now referred for additional evaluation.   PAST MEDICAL HISTORY:  Notable for a ruptured appendix many years ago.  He  had a hernia repair ten years ago.  He has a history of hypertension which  began recently.   SOCIAL HISTORY:  The patient is married and lives in [place].  He is retired  from [employer] business.  He denies tobacco or alcohol use.  He  practices a Mediterranean diet and has, otherwise, been very healthy.   FAMILY HISTORY:  Notable for a mother dying in her 46s of natural causes and  a father dying in his late 7s.  There was no history of coronary artery  disease in the family.   REVIEW OF SYMPTOMS:  Negative for fever, chills, night sweats, or swelling.  He denies headache or sinus problems.  He denies skin rashes or lesions.  He  denies chest pain, shortness of breath, dyspnea, or orthopnea.  He does have  very mild fatigue.  He denies  syncope.  He does admit to an occasional cough  over the last several weeks.  He denies dysuria, hematuria, or nocturia.  He  denies skin problems.  He does admit to some arthralgias in his hips and  legs which have been present for many years.  He denies nausea, vomiting,  diarrhea, or constipation, polyuria, or polydipsia.  He denies any  neurologic problems. The rest of his review of systems was normal.   PHYSICAL EXAMINATION:  GENERAL:  He is a pleasant, elderly, well appearing man in no distress.  VITAL SIGNS:  Blood pressure 150/70, pulse 50 and regular, respirations 16,  temperature 98.  HEENT:  Normocephalic, atraumatic, pupils equal and round, oropharynx moist,  sclerae anicteric.  NECK:  No jugular venous distention, there is no thyromegaly, the trachea  was midline, carotids are 2+ and symmetric.  LUNGS:  Clear to auscultation bilaterally.  No wheezes, rales, or rhonchi.  There is no increased work  of breathing.  HEART:  Regular bradycardia with normal S1 and S2, there are no obvious  murmurs.  PMI was not laterally displaced nor was it enlarged.  ABDOMEN:  Soft, nontender, nondistended, no organomegaly.  Bowel sounds  present, there is no rebound or guarding.  EXTREMITIES:  No cyanosis, clubbing, and edema.  SKIN:  Normal.  NEUROLOGICAL:  Normal with cranial nerves 2-12 intact, strength 5/5 and  symmetric.   LABORATORY DATA:  EKG demonstrates sinus bradycardia with first degree AV  block.   IMPRESSION:  1.  Sinus bradycardia with heart rates spontaneously down into the 20s and      pauses up to five seconds.  2.  Hypertension.  3.  Arthritis.   DISCUSSION:  The patient has no evidence of thyroid dysfunction.  He did  have mild evidence of heart failure based on his BNP, but not any  significant dyspnea.  I discussed the treatment options with the patient.  The risks, benefits, goals, and expectations of permanent pacemaker  insertion have been discussed with the  patient and he wishes to proceed with  pacemaker insertion.           ______________________________  Doylene Canning. Ladona Ridgel, M.D.     GWT/MEDQ  D:  10/20/2005  T:  10/20/2005  Job:  [id number]   cc:   Dorthula Rue. Early Chars, MD  Fax: [phone]   Vida Roller, M.D.  Fax: [phone]

## 2011-02-10 NOTE — Op Note (Signed)
[name] [name], [name] [name]             ACCOUNT NO.:  [id number]   MEDICAL RECORD NO.:  [id number]          PATIENT TYPE:  OIB   LOCATION:  2899                         FACILITY:  [facility]   PHYSICIAN:  Doylene Canning. Ladona Ridgel, M.D.  DATE OF BIRTH:  [birth year]   DATE OF PROCEDURE:  11/03/2005  DATE OF DISCHARGE:  11/03/2005                                 OPERATIVE REPORT   PROCEDURE PERFORMED:  Atrial lead revision.   INTRODUCTION:  The patient is a very pleasant 77 year old male with a  history of symptomatic bradycardia and long pauses who underwent permanent  pacemaker implantation several weeks ago. Post procedure the patient was  found to have elevated pacing impedance in the bipolar atrial pacing mode  and non capture. He is reprogrammed to the unipolar mode with subsequent  capture. He presented back to the office where he was found to have atrial  noncapture both unipolar and bipolar and for this reason he is referred for  atrial lead revision.   PROCEDURE:  After informed consent was obtained, the patient is taken  diagnostic EP lab in fasting state. After usual preparation, draping,  intravenous fentanyl Midazolam was given for sedation. 30 mL lidocaine was  infiltrated in the left infraclavicular region. 5 cm incision was carried  out over this region. Electrocautery utilized to dissect down the fascial  plane. The subcutaneous pocket was entered without difficulty and  approximately 15 mL of serosanguineous fluid was evacuated. The generator  was removed from the pocket. The atrial lead was evaluated. The distal set  screw was withdrawn from the back housing and gentle tension placed on the  lead and it was removed. This demonstrated that the proximal set screw had  not attached to the atrial lead itself. The lead was analyzed through the  analyzer and found to be working satisfactorily with P-waves of 3-4 mV and  atrial threshold of less than a volt of 0.5 milliseconds with stable  pacing  impedances. At this point the atrial lead was placed back into the can and  both the distal and proximal set screws were carefully tightened and the  pacemaker and lead was reinterrogated. On this surprisingly enough the pace  impedance in the atrium remained greater than 2500 mV and there was no  atrial capture despite increased outputs at atrial pacing. Again the distal  set screw was loosened and again gentle tension was placed on the atrial  lead and again the lead came out of the can suggesting that the proximal set  screw was defective as twice the proximal set screw had been torqued down  yet it did not make contact with the lead itself. At this point it was also  noted that the ventricular sensing lead had noise on the lead when it was  connected to the can and for this reason the header was identified as a  source of the problem rather than a loose set screw. A new Guidant dual-  chamber pacemaker serial number [id number] insignia 1+ DR model 1297 was  connected to the atrial and ventricular pacing leads and placed back in the  subcutaneous pocket. The atrial and ventricular leads were evaluated through  the generator prior to returned to the pocket and they demonstrated normal  pacing function. At this point the generator secured with silk suture.  Copious amounts of kanamycin were utilized to irrigate the pocket and the  incision was closed with layer of 2-0 Vicryl followed by layer 3-0 Vicryl  followed by layer of 4-0 Vicryl. Benzoin was painted on the skin, Steri-  Strips were applied and a pressure dressing was placed. The patient was  returned to his room in satisfactory condition.   COMPLICATIONS:  There were no immediate procedure complications.   RESULTS:  This demonstrates successful atrial lead revision with removal of  previous implanted pacemaker generator which was found to be defective and  insertion of a new pacemaker generator which was verified be working   correctly. The patient's pacemaker was reprogrammed to the DDD mode at a low  rate at 55 beats a minute with the AV delay turned out to its maximum level.           ______________________________  Doylene Canning. Ladona Ridgel, M.D.     GWT/MEDQ  D:  11/03/2005  T:  11/04/2005  Job:  [id number]   cc:   Dorthula Rue. Early Chars, MD  Fax: [phone]   Vida Roller, M.D.  Fax: [phone]

## 2011-02-10 NOTE — Procedures (Signed)
[name] [name], [name] [name]             ACCOUNT NO.:  [id number]   MEDICAL RECORD NO.:  [id number]          PATIENT TYPE:  INP   LOCATION:  IC04                          FACILITY:  [facility]   PHYSICIAN:  Vida Roller, M.D.   DATE OF BIRTH:  [birth year]   DATE OF PROCEDURE:  10/18/2005  DATE OF DISCHARGE:                                  ECHOCARDIOGRAM   PRIMARY CARE PHYSICIAN:  Dr. Sherle Poe   DATE OF STUDY:  October 18, 2005   TAPE NUMBER:  [id number]   TAPE COUNT:  [id number]   CLINICAL HISTORY:  This is an 77 year old man with bradycardia.  Technical  quality of the study is adequate.   M-MODE TRACINGS:  The aorta is 50 mm.   The left atrium is 47 mm.   The septum is 16 mm.   The posterior wall is 12 mm.   Left ventricular diastolic dimension is 45 mm.   Left ventricular systolic dimension is 30 mm.   TWO-DIMENSIONAL AND DOPPLER IMAGING:  The left ventricle is normal size.  There is preserved LV systolic function with an estimated ejection fraction  of 60-65%.  There are no wall motion abnormalities seen.  There is mild  concentric left ventricular hypertrophy with some upper septal hypertrophy  without left ventricular outflow obstruction.   The right ventricle is top normal in size with normal systolic function.   Both atria appear to be dilated.   The aortic valve is sclerotic with mild insufficiency.  No stenosis is seen.   The mitral valve has moderate annular calcification with mild regurgitation.  No stenosis is seen.   The tricuspid valve has mild regurgitation.   The aortic root appears to be dilated.  It is not entirely well interrogated  and not easily seen.   There is no pericardial effusion, although there is a question of a pleural  effusion versus a large hiatal hernia as there is a collection of fluid that  can be seen in the inferior-posterior portion below the heart.   The inferior vena cava is not well seen.   Recommendation would be to get a CT scan  of the chest to rule out aortic  aneurysm.      Vida Roller, M.D.  Electronically Signed     JH/MEDQ  D:  10/18/2005  T:  10/18/2005  Job:  [id number]

## 2011-02-10 NOTE — Op Note (Signed)
[name] [name], AMSLER             ACCOUNT NO.:  [id number]   MEDICAL RECORD NO.:  [id number]          PATIENT TYPE:  OIB   LOCATION:  2010                         FACILITY:  [facility]   PHYSICIAN:  Doylene Canning. Ladona Ridgel, M.D.  DATE OF BIRTH:  1921-03-19   DATE OF PROCEDURE:  10/20/2005  DATE OF DISCHARGE:                                 OPERATIVE REPORT   PROCEDURE PERFORMED:  Implantation of dual-chamber pacemaker.   INDICATIONS:  Symptomatic bradycardia with hypertension.   INTRODUCTION:  The patient is a very pleasant 77 year old man was admitted  to hospital with heart rates in the 30s and pauses of over 5 seconds despite  not being on any antiarrhythmic drug or AV node or sinus nodal blocking  drugs. He was markedly hypertensive. He is now referred for permanent  pacemaker insertion.   PROCEDURE:  After informed consent was obtained, the patient taken to  diagnostic EP lab in fasting state. After usual preparation and draping,  intravenous fentanyl and Midazolam given for sedation. 30 mL lidocaine was  infiltrated in the left infraclavicular region. A 5 cm incision was carried  out over this region. Electrocautery utilized to dissect down to the fascial  plane. The left subclavian vein was punctured x2 after 10 mL of contrast  demonstrated the left subclavian vein to be patent. The Guidant model [id number] -  [id number] bipolar active fixation pacing lead was advanced to the right  ventricle and the Guidant model 4087, serial number [id number] active fixation  pacing lead was advanced the right atrium. Mapping was carried out in the  right ventricle. At the final site of the R-waves measured 18 mV and the  pacing threshold 0.9 volts at 0.5 milliseconds with a pace impedance of 849  ohms. 10 volt pacing did not dilate the diaphragm. With the ventricular lead  in satisfactory position, attention was then turned to placement atrial  lead, it was placed in the right atrial appendage where P-waves  measured 2  mV, and pacing impedance 730 ohms. The pacing threshold 0.6 volts at 0.5  milliseconds. At this location the lead was actively fixed and there is also  no diaphragmatic stimulation at 10 volts.  With these satisfactory  parameters, the leads were secured to the subpectoralis fascia with a figure-  of-eight silk suture. The sewing sleeve was also secured with silk suture.  Electrocautery utilized to make subcutaneous pocket. Kanamycin irrigation  utilized to irrigate the pocket. Electrocautery utilized to assure  hemostasis. The Guidant Insignia One Plus DR model 1297 dual-chamber  pacemaker was then connected to the atrial and ventricular pacing leads and  placed in the subcutaneous pocket. Generator was a serial number [id number]. The  generator was secured with silk suture. Additional kanamycin was utilized to  irrigate the pocket. The incision closed with layer of 2-0 Vicryl followed  by layer of 3-0 Vicryl followed by 4-0 Vicryl. Benzoin painted on the skin,  Steri-Strips were applied and pressure dressing placed. The patient returned  to his room in satisfactory condition.   COMPLICATIONS:  There were no immediate procedure complications.   RESULTS:  Successful implantation  of a Guidant dual-chamber pacemaker in a  patient with symptomatic bradycardia.           ______________________________  Doylene Canning. Ladona Ridgel, M.D.     GWT/MEDQ  D:  10/20/2005  T:  10/21/2005  Job:  [id number]   cc:   Dorthula Rue. Early Chars, MD  Fax: [phone]   Vida Roller, M.D.  Fax: [phone]

## 2011-03-31 ENCOUNTER — Telehealth: Payer: Self-pay | Admitting: Internal Medicine

## 2011-03-31 NOTE — Telephone Encounter (Signed)
Pt wants to know when it is time for him to have a pacer check   Call pt at [phone]   Patient aware that it may be next week before return call.

## 2011-03-31 NOTE — Telephone Encounter (Signed)
Pt. Called. He states  needs an appointment with Dr. Ladona Ridgel for [facility] maker check. Last visit was 08/22/10. He wants the appointment to be made for [facility] office.

## 2011-04-13 ENCOUNTER — Encounter: Payer: Self-pay | Admitting: Internal Medicine

## 2011-04-13 ENCOUNTER — Ambulatory Visit (INDEPENDENT_AMBULATORY_CARE_PROVIDER_SITE_OTHER): Payer: Medicare Other | Admitting: Internal Medicine

## 2011-04-13 VITALS — BP 123/63 | HR 55 | Ht 71.0 in | Wt 149.0 lb

## 2011-04-13 DIAGNOSIS — I498 Other specified cardiac arrhythmias: Secondary | ICD-10-CM

## 2011-04-13 NOTE — Progress Notes (Signed)
HPI Mr. Tejada returns today for followup. He is a very pleasant 77 year old man with a history of symptomatic bradycardia, hypertension, dyslipidemia, who is status post permanent pacemaker insertion. Despite his advanced age, he continues to do quite well. He denies chest pain, shortness of breath, syncope, or peripheral edema. He remains active. Not on File   No current outpatient prescriptions on file.     No past medical history on file.  ROS:   All systems reviewed and negative except as noted in the HPI.   No past surgical history on file.   No family history on file.   History   Social History  . Marital Status: Married    Spouse Name: N/A    Number of Children: N/A  . Years of Education: N/A   Occupational History  . Not on file.   Social History Main Topics  . Smoking status: Never Smoker   . Smokeless tobacco: Never Used  . Alcohol Use: No  . Drug Use: No  . Sexually Active: Not on file   Other Topics Concern  . Not on file   Social History Narrative  . No narrative on file     BP 123/63  Pulse 55  Ht 5\' 11"  (1.803 m)  Wt 149 lb (67.586 kg)  BMI 20.78 kg/m2  Physical Exam:  Elderly but well appearing NAD HEENT: Unremarkable Neck:  No JVD, no thyromegally Lymphatics:  No adenopathy Back:  No CVA tenderness Lungs:  Clear. Well-healed pacemaker incision HEART:  Regular rate rhythm, no murmurs, no rubs, no clicks Abd:  soft, positive bowel sounds, no organomegally, no rebound, no guarding Ext:  2 plus pulses, no edema, no cyanosis, no clubbing Skin:  No rashes no nodules Neuro:  CN II through XII intact, motor grossly intact  EKG  DEVICE  Normal device function.  See PaceArt for details.   Assess/Plan:

## 2011-09-06 ENCOUNTER — Encounter: Payer: Self-pay | Admitting: Internal Medicine

## 2011-10-19 ENCOUNTER — Ambulatory Visit (INDEPENDENT_AMBULATORY_CARE_PROVIDER_SITE_OTHER): Payer: Medicare Other | Admitting: Otolaryngology

## 2011-11-10 ENCOUNTER — Encounter: Payer: Medicare Other | Admitting: *Deleted

## 2012-01-12 ENCOUNTER — Ambulatory Visit (INDEPENDENT_AMBULATORY_CARE_PROVIDER_SITE_OTHER): Payer: Medicare Other | Admitting: *Deleted

## 2012-01-12 ENCOUNTER — Encounter: Payer: Self-pay | Admitting: Internal Medicine

## 2012-01-12 DIAGNOSIS — I498 Other specified cardiac arrhythmias: Secondary | ICD-10-CM

## 2012-01-12 LAB — PACEMAKER DEVICE OBSERVATION
AL AMPLITUDE: 1.1 mv
ATRIAL PACING PM: 49
BAMS-0001: 170 {beats}/min
BAMS-0002: 8 ms
BAMS-0003: 70 {beats}/min
DEVICE MODEL PM: 314437
RV LEAD IMPEDENCE PM: 660 Ohm
RV LEAD THRESHOLD: 0.8 V
VENTRICULAR PACING PM: 43

## 2012-01-12 NOTE — Progress Notes (Signed)
PPM check 

## 2012-02-29 ENCOUNTER — Ambulatory Visit (INDEPENDENT_AMBULATORY_CARE_PROVIDER_SITE_OTHER): Payer: Medicare Other | Admitting: Otolaryngology

## 2012-02-29 DIAGNOSIS — C8589 Other specified types of non-Hodgkin lymphoma, extranodal and solid organ sites: Secondary | ICD-10-CM

## 2012-02-29 DIAGNOSIS — C148 Malignant neoplasm of overlapping sites of lip, oral cavity and pharynx: Secondary | ICD-10-CM

## 2012-03-01 ENCOUNTER — Ambulatory Visit (HOSPITAL_COMMUNITY): Payer: Medicare Other

## 2012-04-01 ENCOUNTER — Encounter: Payer: Medicare Other | Admitting: Internal Medicine

## 2012-05-10 ENCOUNTER — Encounter: Payer: Self-pay | Admitting: Internal Medicine

## 2012-05-10 ENCOUNTER — Ambulatory Visit (INDEPENDENT_AMBULATORY_CARE_PROVIDER_SITE_OTHER): Payer: Medicare Other | Admitting: Internal Medicine

## 2012-05-10 VITALS — BP 124/60 | HR 60 | Ht 71.0 in | Wt 142.2 lb

## 2012-05-10 DIAGNOSIS — I10 Essential (primary) hypertension: Secondary | ICD-10-CM

## 2012-05-10 DIAGNOSIS — I498 Other specified cardiac arrhythmias: Secondary | ICD-10-CM

## 2012-05-10 DIAGNOSIS — C8590 Non-Hodgkin lymphoma, unspecified, unspecified site: Secondary | ICD-10-CM

## 2012-05-10 DIAGNOSIS — C8589 Other specified types of non-Hodgkin lymphoma, extranodal and solid organ sites: Secondary | ICD-10-CM

## 2012-05-10 DIAGNOSIS — Z950 Presence of cardiac pacemaker: Secondary | ICD-10-CM

## 2012-05-10 LAB — PACEMAKER DEVICE OBSERVATION
AL AMPLITUDE: 1.4 mv
AL IMPEDENCE PM: 540 Ohm
AL THRESHOLD: 0.4 V
BAMS-0001: 170 {beats}/min
BAMS-0002: 8 ms
RV LEAD AMPLITUDE: 11.5 mv
RV LEAD IMPEDENCE PM: 660 Ohm

## 2012-05-10 NOTE — Assessment & Plan Note (Signed)
At the present time, the patient has chosen not to undergo chemotherapy or radiation therapy.

## 2012-05-10 NOTE — Assessment & Plan Note (Signed)
His blood pressure is well controlled. He'll continue his dietary therapy.

## 2012-05-10 NOTE — Assessment & Plan Note (Signed)
His pacemaker is working normally. We'll plan to recheck in several months. 

## 2012-05-10 NOTE — Progress Notes (Signed)
HPI [name] [name] returns today for followup. He is a very pleasant 78 year old man with a history of symptomatic bradycardia, status post pacemaker insertion. He also has a history of dyslipidemia. The patient has had swelling in his lip and was recently diagnosed with lymphoma. He has chosen not to undergo chemotherapy or radiation. He states that since he has been on vitamin supplements, his lip has not grown. Not on File   Current Outpatient Prescriptions  Medication Sig Dispense Refill  . B Complex Vitamins (B COMPLEX 100 PO) Take 1 tablet by mouth daily.        . Cholecalciferol (VITAMIN D) 400 UNITS capsule Take 400 Units by mouth daily.        . vitamin B-12 (CYANOCOBALAMIN) 500 MCG tablet Take 500 mcg by mouth daily.           Past Medical History  Diagnosis Date  . Hyperlipidemia   . Hypertension   . Peripheral vascular disease     ROS:   All systems reviewed and negative except as noted in the HPI.   Past Surgical History  Procedure Date  . Appendectomy   . Inguinal hernia repair   . Pacemaker insertion      Family History  Problem Relation Age of Onset  . Heart attack Sister   . Cancer Brother      History   Social History  . Marital Status: Married    Spouse Name: N/A    Number of Children: N/A  . Years of Education: N/A   Occupational History  . Not on file.   Social History Main Topics  . Smoking status: Never Smoker   . Smokeless tobacco: Never Used  . Alcohol Use: No  . Drug Use: No  . Sexually Active: Not on file   Other Topics Concern  . Not on file   Social History Narrative   Married2 children     BP 124/60  Pulse 60  Ht 5\' 11"  (1.803 m)  Wt 142 lb 4 oz (64.524 kg)  BMI 19.84 kg/m2  Physical Exam:  Well appearing elderly man, NAD HEENT: Unremarkable except for 3 cm lesion involving the right upper lip. Neck:  No JVD, no thyromegally Lungs:  Clear with no wheezes, rales, or rhonchi. HEART:  Regular rate rhythm, no  murmurs, no rubs, no clicks Abd:  soft, positive bowel sounds, no organomegally, no rebound, no guarding Ext:  2 plus pulses, no edema, no cyanosis, no clubbing Skin:  No rashes no nodules Neuro:  CN II through XII intact, motor grossly intact  DEVICE  Normal device function.  See PaceArt for details.   Assess/Plan:

## 2012-05-14 ENCOUNTER — Encounter: Payer: Self-pay | Admitting: Internal Medicine

## 2012-10-24 ENCOUNTER — Encounter (INDEPENDENT_AMBULATORY_CARE_PROVIDER_SITE_OTHER): Payer: Medicare Other

## 2012-10-24 DIAGNOSIS — C8581 Other specified types of non-Hodgkin lymphoma, lymph nodes of head, face, and neck: Secondary | ICD-10-CM

## 2012-11-13 DIAGNOSIS — C8299 Follicular lymphoma, unspecified, extranodal and solid organ sites: Secondary | ICD-10-CM

## 2012-12-10 ENCOUNTER — Ambulatory Visit (INDEPENDENT_AMBULATORY_CARE_PROVIDER_SITE_OTHER): Payer: 59 | Admitting: *Deleted

## 2012-12-10 ENCOUNTER — Other Ambulatory Visit: Payer: Self-pay | Admitting: Internal Medicine

## 2012-12-10 ENCOUNTER — Encounter: Payer: Self-pay | Admitting: Internal Medicine

## 2012-12-10 DIAGNOSIS — I498 Other specified cardiac arrhythmias: Secondary | ICD-10-CM

## 2012-12-10 LAB — PACEMAKER DEVICE OBSERVATION
AL AMPLITUDE: 1 mv
AL IMPEDENCE PM: 540 Ohm
ATRIAL PACING PM: 51
BAMS-0002: 8 ms
BAMS-0003: 70 {beats}/min
RV LEAD AMPLITUDE: 12 mv
RV LEAD IMPEDENCE PM: 700 Ohm
RV LEAD THRESHOLD: 0.9 V
VENTRICULAR PACING PM: 42

## 2012-12-10 NOTE — Progress Notes (Signed)
PPM check 

## 2013-04-09 ENCOUNTER — Encounter (INDEPENDENT_AMBULATORY_CARE_PROVIDER_SITE_OTHER): Payer: 59

## 2013-04-09 DIAGNOSIS — C8581 Other specified types of non-Hodgkin lymphoma, lymph nodes of head, face, and neck: Secondary | ICD-10-CM

## 2013-05-06 ENCOUNTER — Encounter: Payer: Self-pay | Admitting: Internal Medicine

## 2013-05-06 ENCOUNTER — Ambulatory Visit (INDEPENDENT_AMBULATORY_CARE_PROVIDER_SITE_OTHER): Payer: 59 | Admitting: Internal Medicine

## 2013-05-06 VITALS — BP 120/66 | HR 78 | Ht 71.0 in | Wt 130.8 lb

## 2013-05-06 DIAGNOSIS — I10 Essential (primary) hypertension: Secondary | ICD-10-CM

## 2013-05-06 DIAGNOSIS — I498 Other specified cardiac arrhythmias: Secondary | ICD-10-CM

## 2013-05-06 DIAGNOSIS — Z950 Presence of cardiac pacemaker: Secondary | ICD-10-CM

## 2013-05-06 LAB — PACEMAKER DEVICE OBSERVATION
AL AMPLITUDE: 1 mv
AL IMPEDENCE PM: 550 Ohm
AL THRESHOLD: 0.6 V
ATRIAL PACING PM: 13
BAMS-0001: 170 {beats}/min
BAMS-0002: 8 ms
BAMS-0003: 70 {beats}/min
DEVICE MODEL PM: 314437
RV LEAD AMPLITUDE: 9.9 mv
RV LEAD IMPEDENCE PM: 720 Ohm
RV LEAD THRESHOLD: 0.9 V
VENTRICULAR PACING PM: 8

## 2013-05-06 NOTE — Assessment & Plan Note (Signed)
His [employer] DDD PPM is working normally. Will recheck in several months.

## 2013-05-06 NOTE — Patient Instructions (Signed)
No medication changes were made  Your physician wants you to follow-up in: 1 year with Dr. Ladona Ridgel.  You will receive a reminder letter in the mail two months in advance. If you don't receive a letter, please call our office to schedule the follow-up appointment.

## 2013-05-06 NOTE — Assessment & Plan Note (Signed)
His blood pressure is well controlled. No change in medical therapy. 

## 2013-05-06 NOTE — Progress Notes (Signed)
HPI [name] [name] returns today for followup. He is a pleasant 79 yo man with a h/o symptomatic bradycardia, s/p PPM insertion, HTN and dyslipidemia. In the interim, he has fallen on a glass table and cut his lip badly. No syncope. No chest pain or sob. No Known Allergies   Current Outpatient Prescriptions  Medication Sig Dispense Refill  . B Complex Vitamins (B COMPLEX 100 PO) Take 1 tablet by mouth daily.        . Cholecalciferol (VITAMIN D) 400 UNITS capsule Take 400 Units by mouth daily.        . traMADol (ULTRAM) 50 MG tablet Take 50 mg by mouth 4 (four) times daily as needed for pain.      . vitamin B-12 (CYANOCOBALAMIN) 500 MCG tablet Take 500 mcg by mouth daily.         No current facility-administered medications for this visit.     Past Medical History  Diagnosis Date  . Hyperlipidemia   . Hypertension   . Peripheral vascular disease     ROS:   All systems reviewed and negative except as noted in the HPI.   Past Surgical History  Procedure Laterality Date  . Appendectomy    . Inguinal hernia repair    . Pacemaker insertion       Family History  Problem Relation Age of Onset  . Heart attack Sister   . Cancer Brother      History   Social History  . Marital Status: Married    Spouse Name: N/A    Number of Children: N/A  . Years of Education: N/A   Occupational History  . Not on file.   Social History Main Topics  . Smoking status: Never Smoker   . Smokeless tobacco: Never Used  . Alcohol Use: No  . Drug Use: No  . Sexually Active: Not on file   Other Topics Concern  . Not on file   Social History Narrative   Married   2 children     BP 120/66  Pulse 78  Ht 5\' 11"  (1.803 m)  Wt 130 lb 12.8 oz (59.33 kg)  BMI 18.25 kg/m2  SpO2 94%  Physical Exam:  Well appearing 79 yo man NAD HEENT: Unremarkable except for a healing cut lip. Neck:  7 cm JVD, no thyromegally Back:  No CVA tenderness Lungs:  Clear with no wheezes, rales, or  rhonchi. HEART:  Regular rate rhythm, no murmurs, no rubs, no clicks Abd:  soft, positive bowel sounds, no organomegally, no rebound, no guarding Ext:  2 plus pulses, no edema, no cyanosis, no clubbing Skin:  No rashes no nodules Neuro:  CN II through XII intact, motor grossly intact   DEVICE  Normal device function.  See PaceArt for details.   Assess/Plan:

## 2013-05-09 ENCOUNTER — Encounter: Payer: Self-pay | Admitting: Internal Medicine

## 2013-06-25 DEATH — deceased

## 2013-11-12 ENCOUNTER — Encounter: Payer: Self-pay | Admitting: *Deleted

## 2014-02-10 ENCOUNTER — Telehealth: Payer: Self-pay

## 2014-02-10 NOTE — Telephone Encounter (Signed)
Patient past away @ [facility] per [place]
